# Patient Record
Sex: Female | Born: 1990 | Race: Black or African American | Hispanic: No | Marital: Single | Smoking: Current some day smoker
Health system: Southern US, Community
[De-identification: ages and names within clinical notes are randomized; demographics above are authoritative.]

## PROBLEM LIST (undated history)

## (undated) DIAGNOSIS — J45909 Unspecified asthma, uncomplicated: Secondary | ICD-10-CM

## (undated) DIAGNOSIS — T7840XA Allergy, unspecified, initial encounter: Secondary | ICD-10-CM

## (undated) HISTORY — DX: Allergy, unspecified, initial encounter: T78.40XA

## (undated) HISTORY — DX: Unspecified asthma, uncomplicated: J45.909

---
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
## 2008-11-27 IMAGING — CR DG CHEST 2V
2 series · 2 of 2 positions shown · non-contrast
Comparison: None.

CLINICAL DATA: Chest pain and cough. 
 CHEST - 2 VIEW:

[w chest pa]
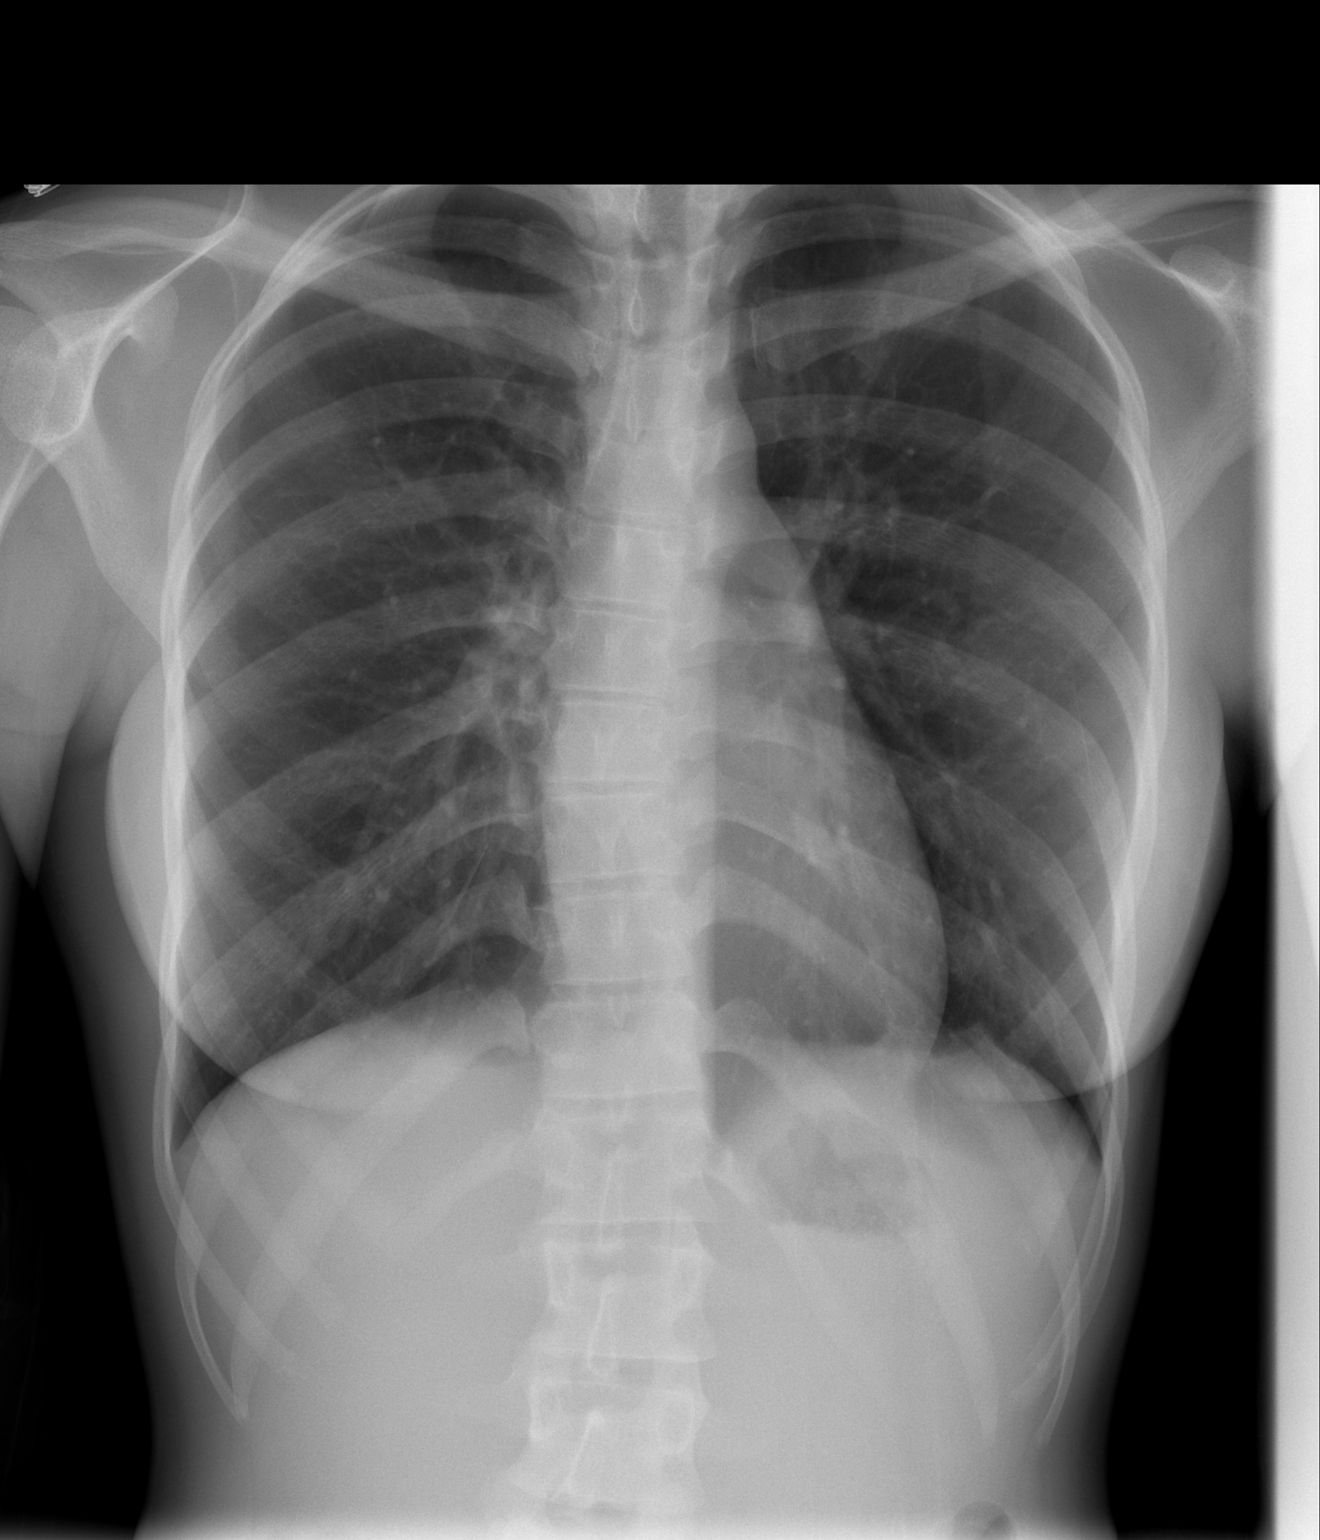

[w chest lat]
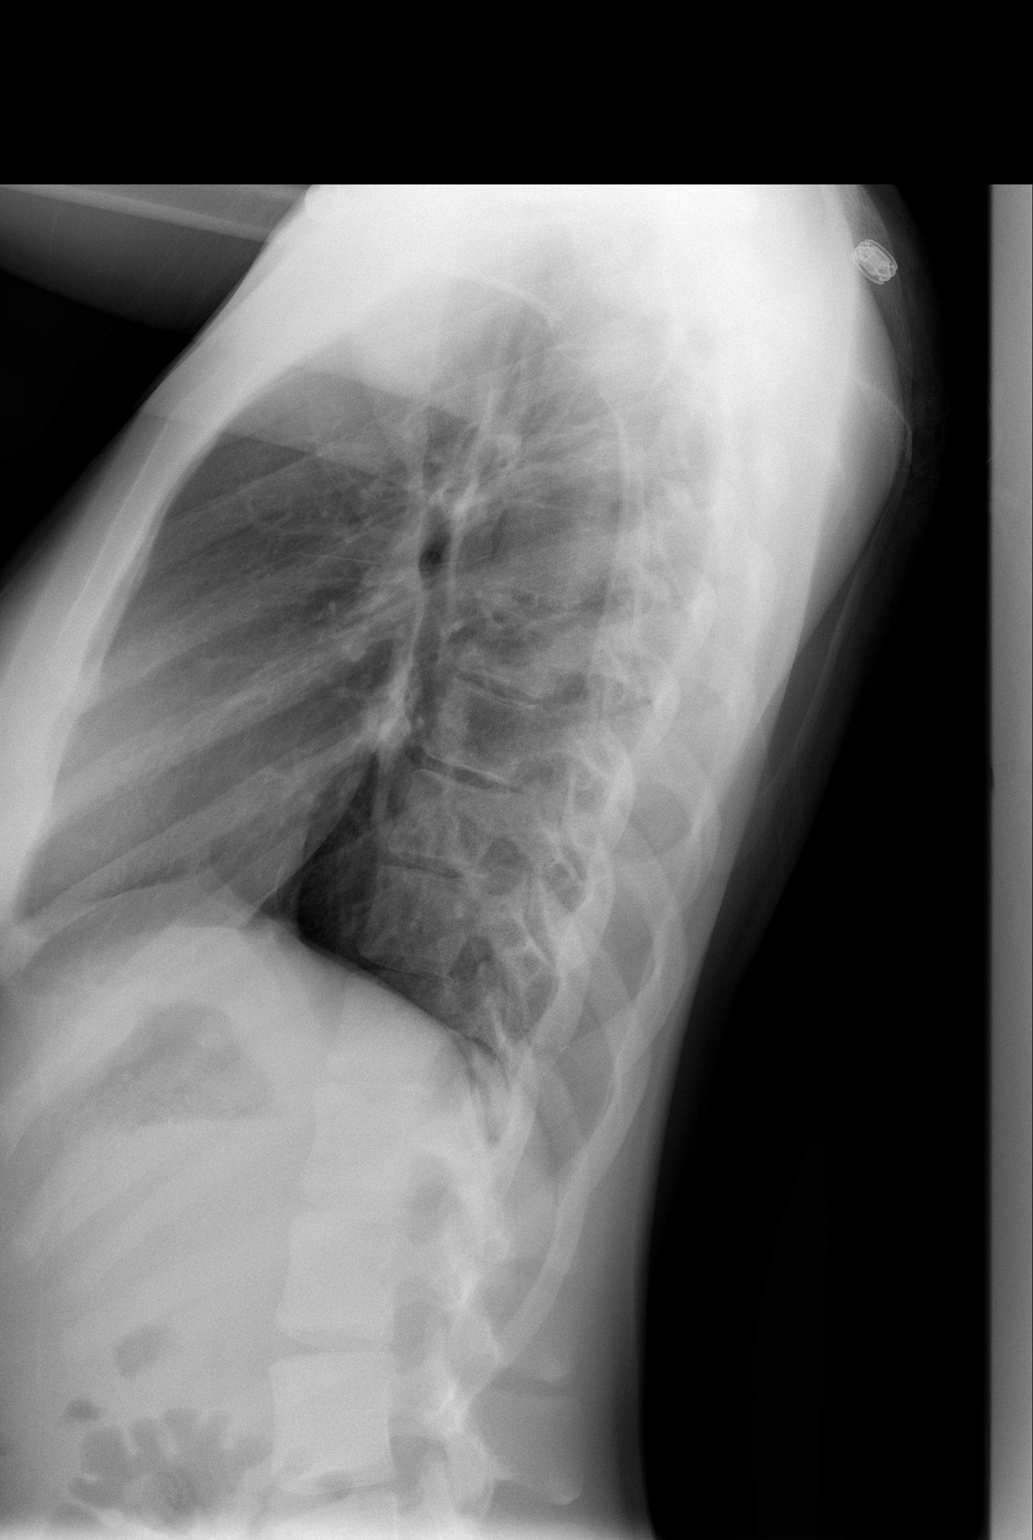

[2 of 2 positions shown; findings below may reference images not displayed]

FINDINGS: The heart size and mediastinal contours are within normal limits.  Both lungs are clear.  The visualized skeletal structures are unremarkable.
IMPRESSION: No active cardiopulmonary disease.

## 2016-09-16 ENCOUNTER — Encounter: Payer: Self-pay | Admitting: *Deleted

## 2016-09-16 NOTE — Progress Notes (Signed)
This encounter was created in error - please disregard.

## 2019-03-07 ENCOUNTER — Encounter: Payer: Self-pay | Admitting: Medical

## 2019-03-07 ENCOUNTER — Ambulatory Visit (INDEPENDENT_AMBULATORY_CARE_PROVIDER_SITE_OTHER): Payer: 59 | Admitting: Medical

## 2019-03-07 ENCOUNTER — Telehealth: Payer: Self-pay

## 2019-03-07 ENCOUNTER — Telehealth: Payer: Self-pay | Admitting: General Practice

## 2019-03-07 ENCOUNTER — Other Ambulatory Visit: Payer: Self-pay

## 2019-03-07 VITALS — Ht 62.0 in | Wt 105.0 lb

## 2019-03-07 DIAGNOSIS — F1990 Other psychoactive substance use, unspecified, uncomplicated: Secondary | ICD-10-CM | POA: Diagnosis not present

## 2019-03-07 DIAGNOSIS — R05 Cough: Secondary | ICD-10-CM

## 2019-03-07 DIAGNOSIS — R0981 Nasal congestion: Secondary | ICD-10-CM

## 2019-03-07 DIAGNOSIS — R059 Cough, unspecified: Secondary | ICD-10-CM

## 2019-03-07 DIAGNOSIS — R112 Nausea with vomiting, unspecified: Secondary | ICD-10-CM

## 2019-03-07 DIAGNOSIS — J45909 Unspecified asthma, uncomplicated: Secondary | ICD-10-CM

## 2019-03-07 MED ORDER — ALBUTEROL SULFATE HFA 108 (90 BASE) MCG/ACT IN AERS: 2.0000 | INHALATION_SPRAY | Freq: Four times a day (QID) | RESPIRATORY_TRACT | 0 refills | Status: DC | PRN

## 2019-03-07 MED ORDER — ONDANSETRON 4 MG PO TBDP: 4.0000 mg | ORAL_TABLET | Freq: Three times a day (TID) | ORAL | 0 refills | Status: AC | PRN

## 2019-03-07 MED ORDER — BENZONATATE 100 MG PO CAPS: 100.0000 mg | ORAL_CAPSULE | Freq: Three times a day (TID) | ORAL | 0 refills | Status: AC | PRN

## 2019-03-07 MED ORDER — CEPHALEXIN 500 MG PO CAPS: 500.0000 mg | ORAL_CAPSULE | Freq: Two times a day (BID) | ORAL | 0 refills | Status: AC

## 2019-03-07 NOTE — Telephone Encounter (Signed)
Note faxed.

## 2019-03-07 NOTE — Telephone Encounter (Signed)
Patient called in needing a Dr. Notes from her Doctor's visit  today with Dr. Alvira Monday please fax the notes to Fax: [phone] Att: Mrs. Orson Aloe  Can someone please follow up with the patient at [phone] thanks.

## 2019-03-07 NOTE — Progress Notes (Signed)
Subjective:    Patient ID: Jean Becker, female    DOB: January 24, 1991, 29 y.o.   MRN: [id number]  HPI  Virtual Visit via Video Note  I connected with Jean Becker on 03/07/19 at 10:40 AM EST by a video enabled telemedicine application and verified that I am speaking with the correct person using two identifiers.  Location: Patient: home Provider: office   I discussed the limitations of evaluation and management by telemedicine and the availability of in person appointments. The patient expressed understanding and agreed to proceed.    I discussed the assessment and treatment plan with the patient. The patient was provided an opportunity to ask questions and all were answered. The patient agreed with the plan and demonstrated an understanding of the instructions.   The patient was advised to call back or seek an in-person evaluation if the symptoms worsen or if the condition fails to improve as anticipated.  I provided 30 minutes of non-face-to-face time during this encounter.   Mackie Pai, PA-C  Pt is working and is Ship broker. Pt has hx of allergies and asthma.   Pt in with some nasal congestion, cough, nausea and abdomen pain. She states she had some nasal drainage and slight burning to nose. Signs/symptoms for about on Thursday last week. Pt vomited some this weekend. No diarrhea.   Some fever Thursday and Friday lat week.    Pt patient coughs will bring up mucus.   No wheezing or shortness of breath.  Pt works for [employer].  Pt states she admits also states recently had overdosed and per her report sounds like given narcan.  lmp-Feb 14, 2019.   Review of Systems  Constitutional: Positive for fatigue and fever. Negative for chills.  HENT: Positive for congestion and postnasal drip. Negative for rhinorrhea, sinus pressure and sinus pain.   Respiratory: Positive for cough. Negative for chest tightness and wheezing.   Cardiovascular: Negative for palpitations.    Gastrointestinal: Positive for nausea. Negative for abdominal pain and vomiting.  Genitourinary: Negative for dysuria.  Musculoskeletal: Negative for back pain and myalgias.  Skin: Negative for rash.  Neurological: Negative for dizziness, speech difficulty, weakness and headaches.  Hematological: Negative for adenopathy. Does not bruise/bleed easily.  Psychiatric/Behavioral: Negative for behavioral problems and confusion.    No past medical history on file.   Social History   Socioeconomic History  . Marital status: Single    Spouse name: Not on file  . Number of children: Not on file  . Years of education: Not on file  . Highest education level: Not on file  Occupational History  . Not on file  Tobacco Use  . Smoking status: Current Some Day Smoker    Types: Cigars  . Smokeless tobacco: Never Used  Substance and Sexual Activity  . Alcohol use: Never  . Drug use: Never  . Sexual activity: Not on file  Other Topics Concern  . Not on file  Social History Narrative  . Not on file   Social Determinants of Health   Financial Resource Strain:   . Difficulty of Paying Living Expenses: Not on file  Food Insecurity:   . Worried About Charity fundraiser in the Last Year: Not on file  . Ran Out of Food in the Last Year: Not on file  Transportation Needs:   . Lack of Transportation (Medical): Not on file  . Lack of Transportation (Non-Medical): Not on file  Physical Activity:   . Days of Exercise per  Week: Not on file  . Minutes of Exercise per Session: Not on file  Stress:   . Feeling of Stress : Not on file  Social Connections:   . Frequency of Communication with Friends and Family: Not on file  . Frequency of Social Gatherings with Friends and Family: Not on file  . Attends Religious Services: Not on file  . Active Member of Clubs or Organizations: Not on file  . Attends Banker Meetings: Not on file  . Marital Status: Not on file  Intimate Partner  Violence:   . Fear of Current or Ex-Partner: Not on file  . Emotionally Abused: Not on file  . Physically Abused: Not on file  . Sexually Abused: Not on file      No family history on file.  No Known Allergies  No current outpatient medications on file prior to visit.   No current facility-administered medications on file prior to visit.    Ht 5\' 2"  (1.575 m)   Wt 105 lb (47.6 kg)   BMI 19.20 kg/m       Objective:   Physical Exam  General-no acute distress, pleasant, oriented. Lungs- on inspection lungs appear unlabored. Neck- no tracheal deviation or jvd on inspection. Neuro- gross motor function appears intact.      Assessment & Plan:  You do have various symptoms recently was called concern for possible Covid infection.  Gave you the number of the testing center to call and get scheduled.  Also gave you work note.  We faxed it to the number per your request.  Attention to Ms. Henderson as well.  Your symptoms of productive cough intermittently causes concern for bronchitis.  We will go ahead and prescribe Keflex antibiotic.  You do have a history of asthma and we will also go ahead and make albuterol inhaler available.  For cough I prescribed benzonatate.  You had some GI signs and symptoms recently so went ahead and prescribe Zofran.  No diarrhea reported but if that occurs let know as well.  Reported recent use of drug which caused you to overdose and dated medication.  On video visit appears reluctant to give me exact name of drug use.  We will go into more detail on follow-up visit.  Hopefully your signs and symptoms will improve overall your Covid test will come back negative.  In that event will see you in office for follow-up and also get EKG at that time.  Advised not to use the drug that caused overdose.  [place], PA-C

## 2019-03-07 NOTE — Telephone Encounter (Signed)
Pt called to set up new pt appt. Pt noted being resussictated by the EMS 2x over the weekend and being very sick with fever, cough, nausea, runny nose and chest congestion. Call was disconnected. Called ph# 2x and no answer and VM not setup. Pt needs to proceed to ER for evaluation.

## 2019-03-07 NOTE — Patient Instructions (Addendum)
You do have various symptoms recently was called concern for possible Covid infection.  Gave you the number of the testing center to call and get scheduled.  Also gave you work note.  We faxed it to the number per your request.  Attention to Ms. Henderson as well.  Your symptoms of productive cough intermittently causes concern for bronchitis.  We will go ahead and prescribe Keflex antibiotic.  You do have a history of asthma and we will also go ahead and make albuterol inhaler available.  For cough I prescribed benzonatate.  You had some GI signs and symptoms recently so went ahead and prescribe Zofran.  No diarrhea reported but if that occurs let us know as well.  Reported recent use of drug which caused you to overdose and dated medication.  On video visit appears reluctant to give me exact name of drug use.  We will go into more detail on follow-up visit.  Hopefully your signs and symptoms will improve overall your Covid test will come back negative.  In that event will see you in office for follow-up and also get EKG at that time.  Advised not to use the drug that caused overdose.

## 2019-03-08 ENCOUNTER — Ambulatory Visit: Payer: 59 | Attending: Internal Medicine

## 2019-03-08 DIAGNOSIS — Z20822 Contact with and (suspected) exposure to covid-19: Secondary | ICD-10-CM

## 2019-03-09 LAB — NOVEL CORONAVIRUS, NAA: SARS-CoV-2, NAA: DETECTED — AB

## 2019-03-14 ENCOUNTER — Telehealth: Payer: Self-pay

## 2019-03-14 NOTE — Telephone Encounter (Signed)
Patient called for clarification of information about isolation she received about her COVID-19 positive test result. She states that her symptoms have improve. She was read the criteria for ending isolation. She understands that she is on day 6. Test date 03/08/19. She states she is not having fever and her no cough but still slight runny nose. She patched her mother into the conversation who was also read the criteria for ending isolation.  They both verbalized understanding and denied other questions.

## 2019-03-22 ENCOUNTER — Ambulatory Visit: Payer: 59 | Attending: Internal Medicine

## 2019-03-22 DIAGNOSIS — Z20822 Contact with and (suspected) exposure to covid-19: Secondary | ICD-10-CM

## 2019-03-23 LAB — NOVEL CORONAVIRUS, NAA: SARS-CoV-2, NAA: NOT DETECTED

## 2019-03-24 ENCOUNTER — Telehealth: Payer: Self-pay

## 2019-03-24 NOTE — Telephone Encounter (Signed)
Patient seeking advice regarding returning to normal after having Covid 19.  Care Advice including increase daily activity slowly. Eat healthy and drink plenty of water to stay hydrated. Call back with concerns.

## 2019-03-24 NOTE — Telephone Encounter (Signed)
Pt notified of negative COVID-19 results. Understanding verbalized.  Pt would like to speak to a nurse about what she should do going forward. Will send message to nurse triage.   Joycelyn Rua Hopkins

## 2019-03-29 ENCOUNTER — Other Ambulatory Visit: Payer: Self-pay | Admitting: Medical

## 2019-05-25 ENCOUNTER — Ambulatory Visit: Payer: 59 | Attending: Internal Medicine

## 2019-05-25 DIAGNOSIS — Z20822 Contact with and (suspected) exposure to covid-19: Secondary | ICD-10-CM

## 2019-05-26 LAB — SARS-COV-2, NAA 2 DAY TAT

## 2019-05-26 LAB — NOVEL CORONAVIRUS, NAA: SARS-CoV-2, NAA: NOT DETECTED

## 2019-06-18 ENCOUNTER — Ambulatory Visit: Payer: 59 | Attending: Internal Medicine

## 2019-06-18 DIAGNOSIS — Z23 Encounter for immunization: Secondary | ICD-10-CM

## 2019-06-18 NOTE — Progress Notes (Signed)
   Covid-19 Vaccination Clinic  Name:  Jean Becker    MRN: [id number] DOB: 12-03-1990  06/18/2019  Jean Becker was observed post Covid-19 immunization for 15 minutes without incident. She was provided with Vaccine Information Sheet and instruction to access the V-Safe system.   Jean Becker was instructed to call 911 with any severe reactions post vaccine: Marland Kitchen Difficulty breathing  . Swelling of face and throat  . A fast heartbeat  . A bad rash all over body  . Dizziness and weakness   Immunizations Administered    Name Date Dose VIS Date Route   Pfizer COVID-19 Vaccine 06/18/2019 11:19 AM 0.3 mL 04/06/2018 Intramuscular   Manufacturer: [employer], [employer]   Lot: [id number]   NDC: [address]-3

## 2019-07-12 ENCOUNTER — Ambulatory Visit: Payer: 59 | Attending: Internal Medicine

## 2019-07-12 ENCOUNTER — Ambulatory Visit: Payer: 59

## 2019-07-12 DIAGNOSIS — Z23 Encounter for immunization: Secondary | ICD-10-CM

## 2019-07-12 NOTE — Progress Notes (Signed)
   Covid-19 Vaccination Clinic  Name:  Jean Becker    MRN: [id number] DOB: Mar 21, 1990  07/12/2019  Jean Becker was observed post Covid-19 immunization for 15 minutes without incident. She was provided with Vaccine Information Sheet and instruction to access the V-Safe system.   Jean Becker was instructed to call 911 with any severe reactions post vaccine: Marland Kitchen Difficulty breathing  . Swelling of face and throat  . A fast heartbeat  . A bad rash all over body  . Dizziness and weakness   Immunizations Administered    Name Date Dose VIS Date Route   Pfizer COVID-19 Vaccine 07/12/2019 12:04 PM 0.3 mL 04/06/2018 Intramuscular   Manufacturer: [employer], [employer]   Lot: [id number]   NDC: [address]-3

## 2019-11-22 ENCOUNTER — Other Ambulatory Visit (HOSPITAL_COMMUNITY)
Admission: RE | Admit: 2019-11-22 | Discharge: 2019-11-22 | Disposition: A | Discharge status: Home / Self Care | Payer: 59 | Source: Ambulatory Visit | Attending: Nurse Practitioner | Admitting: Nurse Practitioner

## 2019-11-22 ENCOUNTER — Other Ambulatory Visit: Payer: Self-pay | Admitting: Nurse Practitioner

## 2019-11-22 DIAGNOSIS — N898 Other specified noninflammatory disorders of vagina: Secondary | ICD-10-CM | POA: Diagnosis present

## 2019-11-23 LAB — MOLECULAR ANCILLARY ONLY
Bacterial Vaginitis (gardnerella): POSITIVE — AB
Candida Glabrata: NEGATIVE
Candida Vaginitis: POSITIVE — AB
Chlamydia: NEGATIVE
Comment: NEGATIVE
Comment: NEGATIVE
Comment: NEGATIVE
Comment: NEGATIVE
Comment: NEGATIVE
Comment: NORMAL
Neisseria Gonorrhea: NEGATIVE
Trichomonas: POSITIVE — AB

## 2020-02-22 ENCOUNTER — Other Ambulatory Visit: Payer: Self-pay | Admitting: Medical

## 2020-05-24 ENCOUNTER — Other Ambulatory Visit: Payer: Self-pay

## 2020-05-24 ENCOUNTER — Ambulatory Visit (HOSPITAL_COMMUNITY)
Admission: EM | Admit: 2020-05-24 | Discharge: 2020-05-24 | Disposition: A | Discharge status: Home / Self Care | Payer: Commercial Managed Care - PPO | Attending: Psychiatry | Admitting: Psychiatry

## 2020-05-24 DIAGNOSIS — F411 Generalized anxiety disorder: Secondary | ICD-10-CM

## 2020-05-24 DIAGNOSIS — F331 Major depressive disorder, recurrent, moderate: Secondary | ICD-10-CM

## 2020-05-24 MED ORDER — HYDROXYZINE PAMOATE 25 MG PO CAPS: 25.0000 mg | ORAL_CAPSULE | Freq: Three times a day (TID) | ORAL | 1 refills | Status: AC | PRN

## 2020-05-24 NOTE — Discharge Instructions (Addendum)
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
## 2020-05-24 NOTE — Progress Notes (Signed)
Tatia received her AVS and resources, her questions were answered. She retrieved her personal belongings.She dfwas discharged without incident.

## 2020-05-24 NOTE — BH Assessment (Signed)
Pt reports increased anxiety & a panic attack today. Recent stressors- grief, job loss/changes. Pt states her PCP had prescribed medication but she was taking it PRN, not knowing it was supposed to be daily.
